# Patient Record
Sex: Male | Born: 1965 | Race: White | Hispanic: No | Marital: Married | Smoking: Never smoker
Health system: Southern US, Community
[De-identification: ages and names within clinical notes are randomized; demographics above are authoritative.]

## PROBLEM LIST (undated history)

## (undated) DIAGNOSIS — E7800 Pure hypercholesterolemia, unspecified: Secondary | ICD-10-CM

## (undated) DIAGNOSIS — N200 Calculus of kidney: Secondary | ICD-10-CM

---

## 2009-12-07 ENCOUNTER — Emergency Department: Payer: Self-pay | Admitting: Emergency Medicine

## 2018-08-19 ENCOUNTER — Other Ambulatory Visit: Payer: Self-pay

## 2018-08-19 ENCOUNTER — Emergency Department: Payer: BLUE CROSS/BLUE SHIELD

## 2018-08-19 ENCOUNTER — Emergency Department
Admission: EM | Admit: 2018-08-19 | Discharge: 2018-08-19 | Disposition: A | Discharge status: Home / Self Care | Payer: BLUE CROSS/BLUE SHIELD | Attending: Emergency Medicine | Admitting: Emergency Medicine

## 2018-08-19 DIAGNOSIS — R109 Unspecified abdominal pain: Secondary | ICD-10-CM

## 2018-08-19 DIAGNOSIS — N23 Unspecified renal colic: Secondary | ICD-10-CM | POA: Diagnosis not present

## 2018-08-19 DIAGNOSIS — R1084 Generalized abdominal pain: Secondary | ICD-10-CM | POA: Diagnosis present

## 2018-08-19 LAB — URINALYSIS, COMPLETE (UACMP) WITH MICROSCOPIC
Bilirubin Urine: NEGATIVE
Glucose, UA: NEGATIVE mg/dL
Ketones, ur: NEGATIVE mg/dL
LEUKOCYTE UA: NEGATIVE
NITRITE: NEGATIVE
PH: 8 (ref 5.0–8.0)
Protein, ur: 30 mg/dL — AB
RBC / HPF: >50 RBC/hpf — ABNORMAL HIGH (ref 0–5)
SPECIFIC GRAVITY, URINE: 1.017 (ref 1.005–1.030)

## 2018-08-19 LAB — COMPREHENSIVE METABOLIC PANEL
ALBUMIN: 4 g/dL (ref 3.5–5.0)
ALK PHOS: 83 U/L (ref 38–126)
ALT: 24 U/L (ref 0–44)
AST: 21 U/L (ref 15–41)
Anion gap: 10 (ref 5–15)
BILIRUBIN TOTAL: 0.1 mg/dL — AB (ref 0.3–1.2)
BUN: 19 mg/dL (ref 6–20)
CALCIUM: 8.7 mg/dL — AB (ref 8.9–10.3)
CO2: 26 mmol/L (ref 22–32)
CREATININE: 1.62 mg/dL — AB (ref 0.61–1.24)
Chloride: 104 mmol/L (ref 98–111)
GFR calc Af Amer: 56 mL/min — ABNORMAL LOW (ref >60)
GFR calc non Af Amer: 48 mL/min — ABNORMAL LOW (ref >60)
Glucose, Bld: 157 mg/dL — ABNORMAL HIGH (ref 70–99)
Potassium: 3.9 mmol/L (ref 3.5–5.1)
Sodium: 140 mmol/L (ref 135–145)
TOTAL PROTEIN: 6.6 g/dL (ref 6.5–8.1)

## 2018-08-19 LAB — CBC
HEMATOCRIT: 40 % (ref 39.0–52.0)
HEMOGLOBIN: 13.3 g/dL (ref 13.0–17.0)
MCH: 28.4 pg (ref 26.0–34.0)
MCHC: 33.3 g/dL (ref 30.0–36.0)
MCV: 85.3 fL (ref 80.0–100.0)
NRBC: 0 % (ref 0.0–0.2)
Platelets: 305 10*3/uL (ref 150–400)
RBC: 4.69 MIL/uL (ref 4.22–5.81)
RDW: 12.5 % (ref 11.5–15.5)
WBC: 12.3 10*3/uL — AB (ref 4.0–10.5)

## 2018-08-19 MED ORDER — SODIUM CHLORIDE 0.9 % IV BOLUS
1000.0000 mL | Freq: Once | INTRAVENOUS | Status: AC
  Administered 2018-08-19: 1000 mL via INTRAVENOUS

## 2018-08-19 MED ORDER — ONDANSETRON HCL 4 MG/2ML IJ SOLN
4.0000 mg | Freq: Once | INTRAMUSCULAR | Status: AC
  Administered 2018-08-19: 4 mg via INTRAVENOUS
  Filled 2018-08-19: qty 2

## 2018-08-19 MED ORDER — HYDROMORPHONE HCL 1 MG/ML IJ SOLN
1.0000 mg | Freq: Once | INTRAMUSCULAR | Status: AC
  Administered 2018-08-19: 1 mg via INTRAVENOUS
  Filled 2018-08-19: qty 1

## 2018-08-19 MED ORDER — OXYCODONE-ACETAMINOPHEN 5-325 MG PO TABS: 1.0000 | ORAL_TABLET | ORAL | 0 refills | Status: AC | PRN

## 2018-08-19 MED ORDER — KETOROLAC TROMETHAMINE 30 MG/ML IJ SOLN
15.0000 mg | Freq: Once | INTRAMUSCULAR | Status: AC
  Administered 2018-08-19: 15 mg via INTRAVENOUS
  Filled 2018-08-19: qty 1

## 2018-08-19 MED ORDER — ONDANSETRON 4 MG PO TBDP: 4.0000 mg | ORAL_TABLET | Freq: Three times a day (TID) | ORAL | 0 refills | Status: AC | PRN

## 2018-08-19 MED ORDER — OXYCODONE-ACETAMINOPHEN 5-325 MG PO TABS
1.0000 | ORAL_TABLET | Freq: Once | ORAL | Status: AC
  Administered 2018-08-19: 1 via ORAL
  Filled 2018-08-19: qty 1

## 2018-08-19 MED ORDER — ONDANSETRON 4 MG PO TBDP
4.0000 mg | ORAL_TABLET | Freq: Once | ORAL | Status: AC
  Administered 2018-08-19: 4 mg via ORAL
  Filled 2018-08-19: qty 1

## 2018-08-19 NOTE — ED Provider Notes (Signed)
[facility] Emergency Department Provider Note   ____________________________________________   First MD Initiated Contact with Patient 08/19/18 0111     (approximate)  I have reviewed the triage vital signs and the nursing notes.   HISTORY  Chief Complaint Flank Pain    HPI Parker Hernandez is a 53 y.o. male who presents to the ED from home with a chief complaint of right flank pain.  Patient has a history of kidney stones who had right ureteral ESBL twice at [facility]; the first time on 06/16/2018 with only a fragment of the stone lasted and the second time on 07/07/2018.  Patient states he had a follow-up KUB 2 weeks afterwards and was told all the fragments were gone.  Reports sudden onset right flank pain tonight associated with nausea.  Denies associated fever, chills, cough, congestion, chest pain, shortness of breath, abdominal pain, vomiting, hematuria.  Denies recent travel or trauma.  Denies exposure to persons diagnosed with COVID-19.        Past Medical History Nephrolithiasis Hyperlipidemia  There are no active problems to display for this patient.    Prior to Admission medications   Medication Sig Start Date End Date Taking? Authorizing Provider  ondansetron (ZOFRAN ODT) 4 MG disintegrating tablet Take 1 tablet (4 mg total) by mouth every 8 (eight) hours as needed for nausea or vomiting. 08/19/18   Irean Hong, MD  oxyCODONE-acetaminophen (PERCOCET/ROXICET) 5-325 MG tablet Take 1 tablet by mouth every 4 (four) hours as needed for severe pain. 08/19/18   Irean Hong, MD    Allergies Patient has no known allergies.  No family history on file.  Social History Social History   Tobacco Use  . Smoking status: Not on file  Substance Use Topics  . Alcohol use: Not on file  . Drug use: Not on file    Review of Systems  Constitutional: No fever/chills Eyes: No visual changes. ENT: No sore throat. Cardiovascular: Denies chest pain.  Respiratory: Denies shortness of breath. Gastrointestinal: Positive for right flank pain.  No abdominal pain.  Positive for nausea, no vomiting.  No diarrhea.  No constipation. Genitourinary: Negative for dysuria. Musculoskeletal: Negative for back pain. Skin: Negative for rash. Neurological: Negative for headaches, focal weakness or numbness.   ____________________________________________   PHYSICAL EXAM:  VITAL SIGNS: ED Triage Vitals  Enc Vitals Group     BP 08/19/18 0108 127/75     Pulse Rate 08/19/18 0107 70     Resp 08/19/18 0107 20     Temp 08/19/18 0107 98.4 F (36.9 C)     Temp Source 08/19/18 0107 Oral     SpO2 08/19/18 0107 100 %     Weight 08/19/18 0108 174 lb (78.9 kg)     Height 08/19/18 0108  (1.727 m)     Head Circumference --      Peak Flow --      Pain Score 08/19/18 0108 10     Pain Loc --      Pain Edu? --      Excl. in [place]? --     Constitutional: Alert and oriented. Well appearing and in mild to moderate acute distress. Eyes: Conjunctivae are normal. PERRL. EOMI. Head: Atraumatic. Nose: No congestion/rhinnorhea. Mouth/Throat: Mucous membranes are moist.  Oropharynx non-erythematous. Neck: No stridor.   Cardiovascular: Normal rate, regular rhythm. Grossly normal heart sounds.  Good peripheral circulation. Respiratory: Normal respiratory effort.  No retractions. Lungs CTAB. Gastrointestinal: Soft and nontender to light  or deep palpation. No distention. No abdominal bruits.  Mild right CVA tenderness. Musculoskeletal: No lower extremity tenderness nor edema.  No joint effusions. Neurologic:  Normal speech and language. No gross focal neurologic deficits are appreciated. No gait instability. Skin:  Skin is warm, dry and intact. No rash noted. Psychiatric: Mood and affect are normal. Speech and behavior are normal.  ____________________________________________   LABS (all labs ordered are listed, but only abnormal results are displayed)  Labs  Reviewed  URINALYSIS, COMPLETE (UACMP) WITH MICROSCOPIC - Abnormal; Notable for the following components:      Result Value   Color, Urine YELLOW (*)    APPearance CLOUDY (*)    Hgb urine dipstick MODERATE (*)    Protein, ur 30 (*)    RBC / HPF >50 (*)    Bacteria, UA RARE (*)    All other components within normal limits  CBC - Abnormal; Notable for the following components:   WBC 12.3 (*)    All other components within normal limits  COMPREHENSIVE METABOLIC PANEL - Abnormal; Notable for the following components:   Glucose, Bld 157 (*)    Creatinine, Ser 1.62 (*)    Calcium 8.7 (*)    Total Bilirubin 0.1 (*)    GFR calc non Af Amer 48 (*)    GFR calc Af Amer 56 (*)    All other components within normal limits   ____________________________________________  EKG  None ____________________________________________  RADIOLOGY  ED MD interpretation: 3 x 4 mm right pelvic density may resemble distal ureteral stone  Official radiology report(s): Dg Abdomen 1 View  Result Date: 08/19/2018 CLINICAL DATA:  Right flank pain. History of stones. EXAM: ABDOMEN - 1 VIEW COMPARISON:  Radiograph 12/07/2009 FINDINGS: Stones project over the mid kidneys bilaterally. There is a 3 x 4 mm density in the right pelvis that may be a distal ureteric stone versus overlying stool. Normal bowel gas pattern without obstruction. No evidence of free air. Lung bases are clear. No osseous abnormalities. IMPRESSION: 1. Bilateral nephrolithiasis. 2. A 3 x 4 mm density in the right pelvis may be a distal ureteric stone versus overlying stool. Electronically Signed   By: Narda Rutherford M.D.   On: 08/19/2018 01:51    ____________________________________________   PROCEDURES  Procedure(s) performed (including Critical Care):  Procedures   ____________________________________________   INITIAL IMPRESSION / ASSESSMENT AND PLAN / ED COURSE  As part of my medical decision making, I reviewed the following  data within the electronic MEDICAL RECORD NUMBER Nursing notes reviewed and incorporated, Labs reviewed, Old chart reviewed, Radiograph reviewed and Notes from prior ED visits        53 year old male with a history of kidney stones who presents with right flank pain.  Recent ESBL of right ureteral stone x2. Differential diagnosis includes, but is not limited to, acute appendicitis, renal colic, testicular torsion, urinary tract infection/pyelonephritis, prostatitis,  epididymitis, diverticulitis, small bowel obstruction or ileus, colitis, abdominal aortic aneurysm, gastroenteritis, hernia, etc.  Will obtain basic lab work, urinalysis, initiate IV fluid resuscitation.  Administer 1 mg IV Dilaudid for pain paired with 4 mg IV Zofran for nausea.  Will obtain KUB to evaluate for retained stone.   Clinical Course as of Aug 19 526  Sat Aug 19, 2018  0302 Updated patient on imaging and laboratory results.  Still complaining of severe pain.  Will re-dose IV Dilaudid, add IV Toradol and administer second liter IV fluids as patient has not yet urinated.  States he  cannot tolerate Flomax secondary to dizziness.   [JS]  0424 Updated patient on urine result.  Pain is significantly better.  Will discharge home with prescription for Percocet and Zofran, and he will follow-up with his urologist as needed.  Strict return precautions given.  Patient verbalizes understanding agrees with plan of care.   [JS]    Clinical Course User Index [JS] Irean Hong, MD     ____________________________________________   FINAL CLINICAL IMPRESSION(S) / ED DIAGNOSES  Final diagnoses:  Right flank pain  Ureteral colic     ED Discharge Orders         Ordered    oxyCODONE-acetaminophen (PERCOCET/ROXICET) 5-325 MG tablet  Every 4 hours PRN     08/19/18 0426    ondansetron (ZOFRAN ODT) 4 MG disintegrating tablet  Every 8 hours PRN     08/19/18 0426           Note:  This document was prepared using Dragon voice  recognition software and may include unintentional dictation errors.   Irean Hong, MD 08/19/18 [id number]

## 2018-08-19 NOTE — ED Triage Notes (Signed)
Pt with right flank pain, vomiting that began 2 hours pta. Pt with history of renal calculi. States feels similar to past stones.

## 2018-08-19 NOTE — Discharge Instructions (Addendum)
1. Take pain & nausea medicines as needed (Percocet/Zofran #30). Make sure to take a stool softener while taking narcotic pain medicines. 2. Drink plenty of bottled or filtered water daily. 3. Return to the ER for worsening symptoms, persistent vomiting, fever, difficulty breathing or other concerns.

## 2019-10-28 IMAGING — DX ABDOMEN - 1 VIEW
2 series · 2 of 2 positions shown · non-contrast
Comparison: Radiograph 12/07/2009

CLINICAL DATA: Right flank pain. History of stones.

EXAM:
ABDOMEN - 1 VIEW

[abdomen supine (1 of 2)]
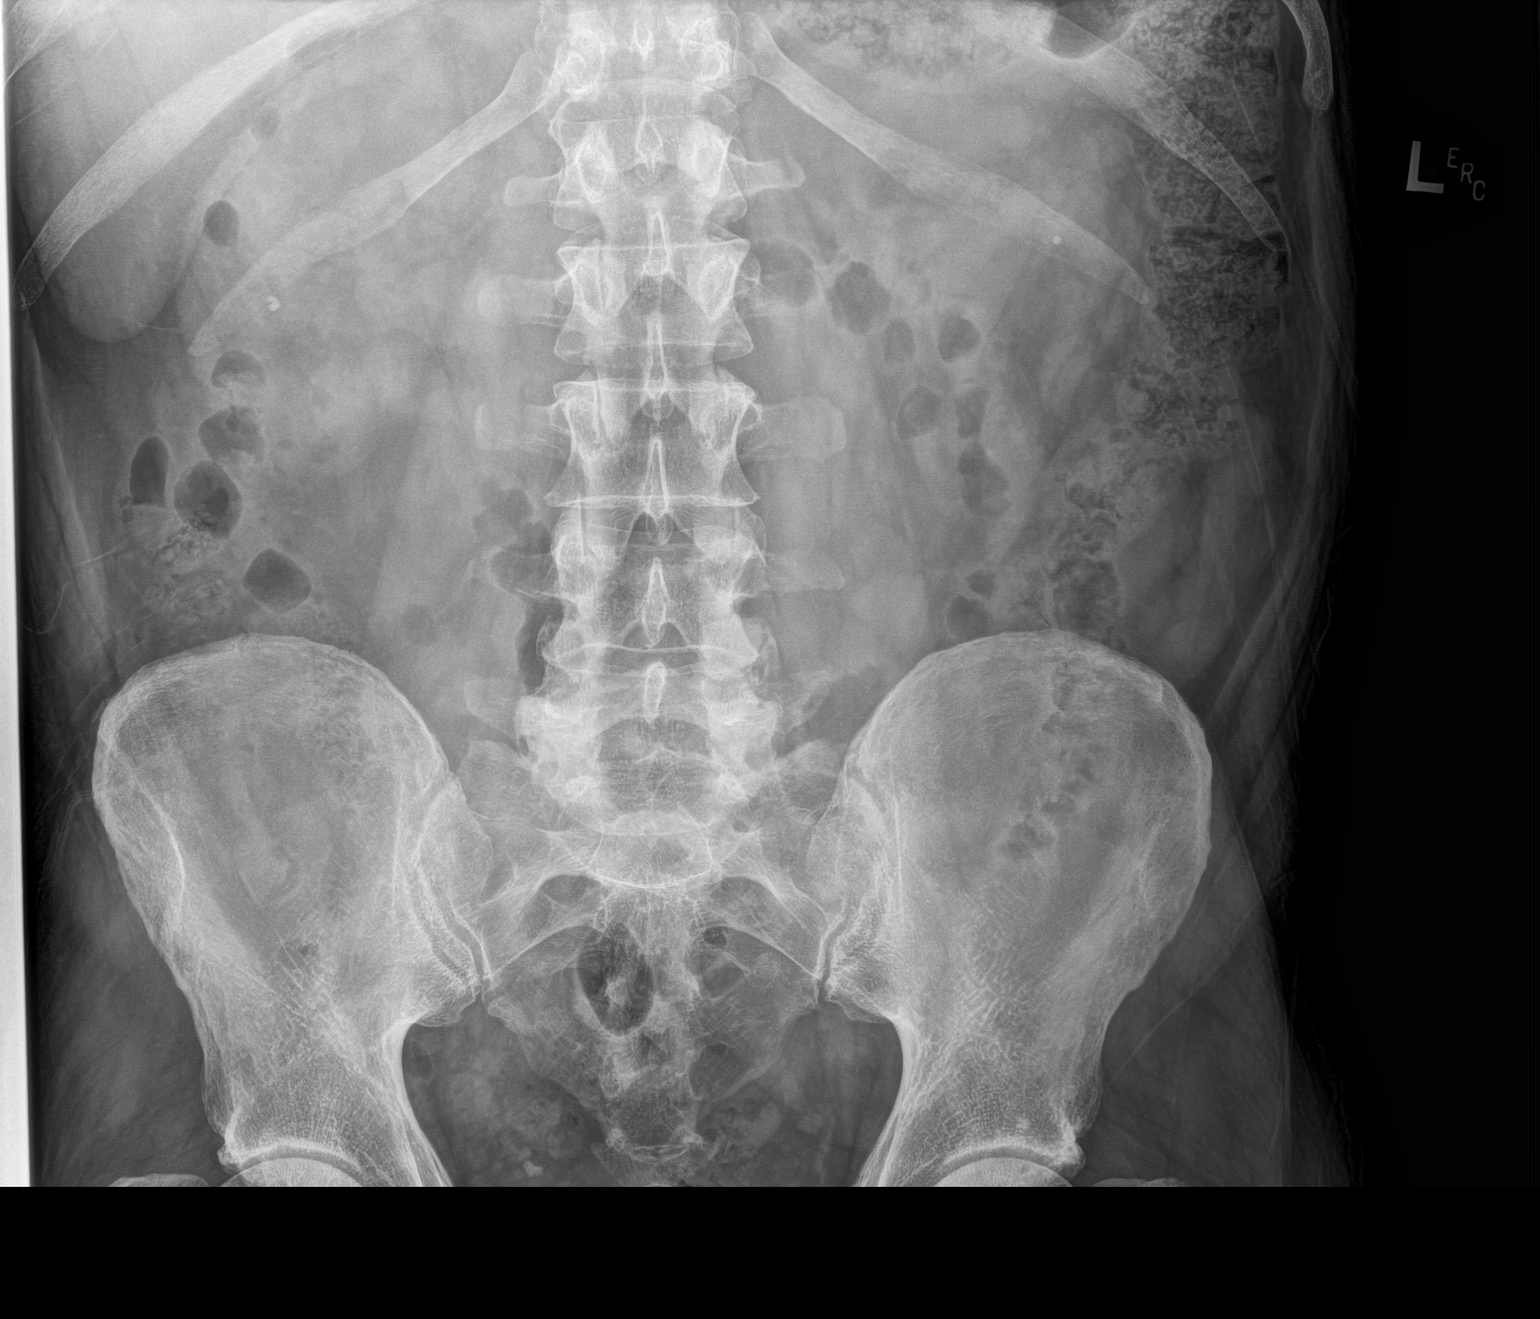

[abdomen supine (2 of 2)]
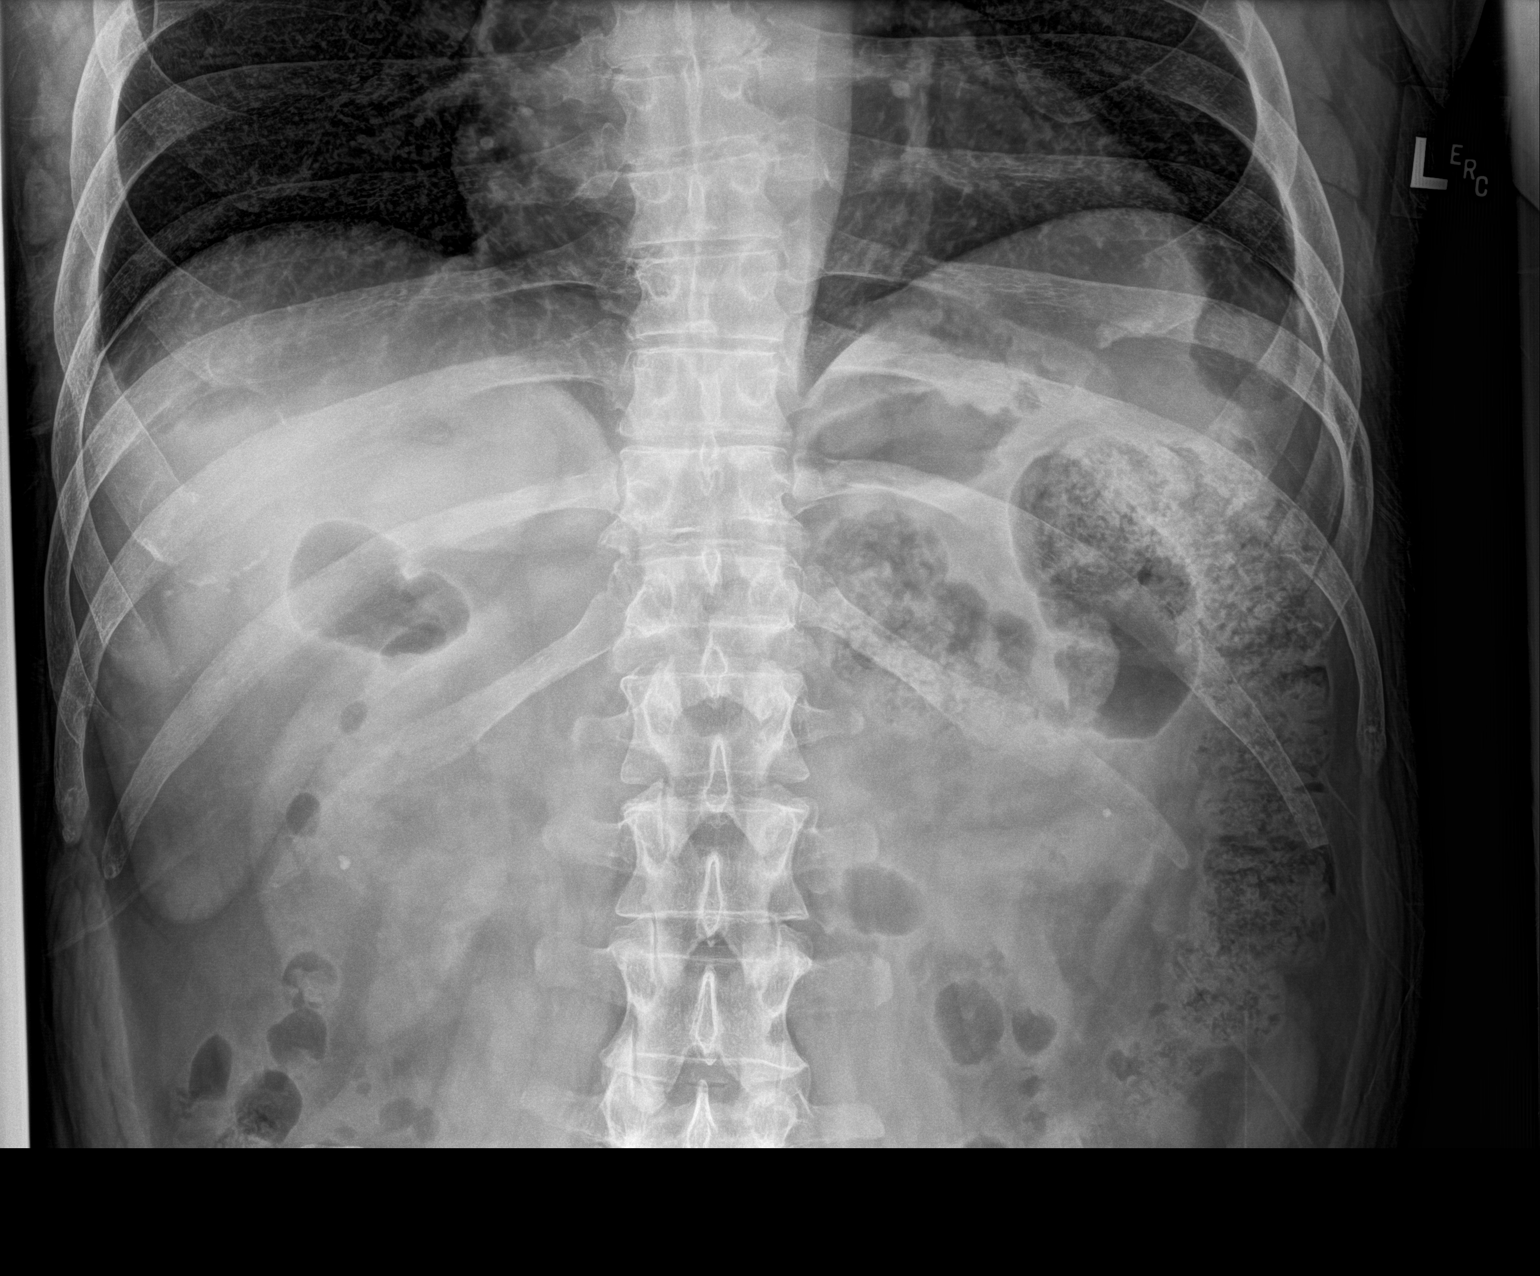

[2 of 2 positions shown; findings below may reference images not displayed]

FINDINGS: Stones project over the mid kidneys bilaterally. There is a 3 x 4 mm
density in the right pelvis that may be a distal ureteric stone
versus overlying stool. Normal bowel gas pattern without
obstruction. No evidence of free air. Lung bases are clear. No
osseous abnormalities.
IMPRESSION: 1. Bilateral nephrolithiasis.
2. A 3 x 4 mm density in the right pelvis may be a distal ureteric
stone versus overlying stool.

## 2021-04-29 ENCOUNTER — Ambulatory Visit
Admission: EM | Admit: 2021-04-29 | Discharge: 2021-04-29 | Disposition: A | Discharge status: Home / Self Care | Payer: BC Managed Care – PPO | Attending: Emergency Medicine | Admitting: Emergency Medicine

## 2021-04-29 ENCOUNTER — Other Ambulatory Visit: Payer: Self-pay

## 2021-04-29 ENCOUNTER — Ambulatory Visit (INDEPENDENT_AMBULATORY_CARE_PROVIDER_SITE_OTHER): Payer: BC Managed Care – PPO

## 2021-04-29 DIAGNOSIS — R109 Unspecified abdominal pain: Secondary | ICD-10-CM | POA: Diagnosis present

## 2021-04-29 HISTORY — DX: Pure hypercholesterolemia, unspecified: E78.00

## 2021-04-29 HISTORY — DX: Calculus of kidney: N20.0

## 2021-04-29 LAB — URINALYSIS, COMPLETE (UACMP) WITH MICROSCOPIC
Bilirubin Urine: NEGATIVE
Glucose, UA: NEGATIVE mg/dL
Hgb urine dipstick: NEGATIVE
Ketones, ur: NEGATIVE mg/dL
Leukocytes,Ua: NEGATIVE
Nitrite: NEGATIVE
Protein, ur: NEGATIVE mg/dL
Specific Gravity, Urine: 1.015 (ref 1.005–1.030)
pH: 6.5 (ref 5.0–8.0)

## 2021-04-29 MED ORDER — KETOROLAC TROMETHAMINE 10 MG PO TABS: 10.0000 mg | ORAL_TABLET | Freq: Four times a day (QID) | ORAL | 0 refills | Status: AC | PRN

## 2021-04-29 MED ORDER — KETOROLAC TROMETHAMINE 60 MG/2ML IM SOLN
60.0000 mg | Freq: Once | INTRAMUSCULAR | Status: AC
  Administered 2021-04-29: 60 mg via INTRAMUSCULAR

## 2021-04-29 MED ORDER — BACLOFEN 10 MG PO TABS: 10.0000 mg | ORAL_TABLET | Freq: Three times a day (TID) | ORAL | 0 refills | Status: AC

## 2021-04-29 NOTE — Discharge Instructions (Addendum)
Your urinalysis today did not show any signs of blood or infection and your x-ray did not show the presence of any kidney stones on the right side of your body.  You have a stable left kidney stone that has been visualized on previous exams and has not moved.  Take the Toradol every 6 hours with food as needed for pain.  Use the baclofen every 8 hours as needed for muscle tension.  The baclofen and Robaxin at the same time.  I would hold the Robaxin for now and just use the baclofen.  I recommend scheduling a follow-up appointment with your urologist if your symptoms continue.

## 2021-04-29 NOTE — ED Triage Notes (Signed)
Patient presents to Urgent Care with complaints of right sided flank pain x 3 days ago. Increased urinary frequency x 2 days ago. Has a hx of kidney stones. Treating pain with warm baths and tylenol with no improvement.  Denies fever, hematuria.

## 2021-04-29 NOTE — ED Provider Notes (Signed)
[facility]    CSN: [id number] Arrival date & time: 04/29/21  1452      History   Chief Complaint Chief Complaint  Patient presents with   Flank Pain   Urinary Frequency    HPI Parker Hernandez is a 55 y.o. male.   HPI  55 year old male here for evaluation of right flank pain.  Patient ports that he has been experiencing pain in his right flank for last 3 days and then developed urinary frequency yesterday.  He denies any pain with urination, urgency of urination, blood in his urine this, nausea, vomiting, or pain in his testicles.  He states that he he tried taking a muscle relaxer in case it was his back and that did not give him any relief.  He has been using Tylenol and soaking in the tub at home to help deal with the pain.  He has a history of renal stones and states this feels similar to his stones in the past.  In the past he is required lithotripsy as the stones were too large to pass by themselves.  Past Medical History:  Diagnosis Date   High cholesterol    Kidney stone     There are no problems to display for this patient.   History reviewed. No pertinent surgical history.     Home Medications    Prior to Admission medications   Medication Sig Start Date End Date Taking? Authorizing Provider  baclofen (LIORESAL) 10 MG tablet Take 1 tablet (10 mg total) by mouth 3 (three) times daily. 04/29/21  Yes Margarette Canada, NP  ketorolac (TORADOL) 10 MG tablet Take 1 tablet (10 mg total) by mouth every 6 (six) hours as needed. 04/29/21  Yes Margarette Canada, NP  methocarbamol (ROBAXIN) 750 MG tablet Take by mouth. 01/08/21  Yes [provider]  pravastatin (PRAVACHOL) 40 MG tablet TAKE ONE TABLET (40 MG DOSE) BY MOUTH DAILY. TAKE 1 TABLET EVERY EVENING 05/10/18  Yes [provider]  ondansetron (ZOFRAN ODT) 4 MG disintegrating tablet Take 1 tablet (4 mg total) by mouth every 8 (eight) hours as needed for nausea or vomiting. 08/19/18   Paulette Blanch, MD   oxyCODONE-acetaminophen (PERCOCET/ROXICET) 5-325 MG tablet Take 1 tablet by mouth every 4 (four) hours as needed for severe pain. 08/19/18   Paulette Blanch, MD    Family History History reviewed. No pertinent family history.  Social History Social History   Tobacco Use   Smoking status: Never   Smokeless tobacco: Never  Vaping Use   Vaping Use: Never used  Substance Use Topics   Alcohol use: Yes    Comment: social drinker   Drug use: Never     Allergies   Patient has no known allergies.   Review of Systems Review of Systems  Constitutional:  Negative for activity change, appetite change and diaphoresis.  Gastrointestinal:  Negative for abdominal pain, nausea and vomiting.  Genitourinary:  Positive for flank pain and frequency. Negative for dysuria, hematuria, testicular pain and urgency.  Musculoskeletal:  Positive for back pain.  Skin:  Negative for rash.  Hematological: Negative.   Psychiatric/Behavioral: Negative.      Physical Exam Triage Vital Signs ED Triage Vitals  Enc Vitals Group     BP 04/29/21 1559 (!) 144/91     Pulse Rate 04/29/21 1559 60     Resp 04/29/21 1559 16     Temp 04/29/21 1559 98.6 F (37 C)     Temp Source 04/29/21  1559 Oral     SpO2 04/29/21 1559 100 %     Weight 04/29/21 1600 182 lb (82.6 kg)     Height --      Head Circumference --      Peak Flow --      Pain Score 04/29/21 1556 8     Pain Loc --      Pain Edu? --      Excl. in [place]? --    No data found.  Updated Vital Signs BP (!) 144/91 (BP Location: Left Arm)   Pulse 60   Temp 98.6 F (37 C) (Oral)   Resp 16   Wt 182 lb (82.6 kg)   SpO2 100%   BMI 27.67 kg/m   Visual Acuity Right Eye Distance:   Left Eye Distance:   Bilateral Distance:    Right Eye Near:   Left Eye Near:    Bilateral Near:     Physical Exam Vitals and nursing note reviewed.  Constitutional:      General: He is in acute distress.     Appearance: Normal appearance. He is not ill-appearing.   HENT:     Head: Normocephalic and atraumatic.  Cardiovascular:     Rate and Rhythm: Normal rate and regular rhythm.     Pulses: Normal pulses.     Heart sounds: Normal heart sounds. No murmur heard.   No gallop.  Pulmonary:     Effort: Pulmonary effort is normal.     Breath sounds: Normal breath sounds. No wheezing, rhonchi or rales.  Abdominal:     General: There is no distension.     Tenderness: There is no abdominal tenderness. There is no right CVA tenderness, left CVA tenderness, guarding or rebound.  Skin:    General: Skin is warm and dry.     Capillary Refill: Capillary refill takes less than 2 seconds.     Findings: No erythema or rash.  Neurological:     General: No focal deficit present.     Mental Status: He is alert and oriented to person, place, and time.  Psychiatric:        Mood and Affect: Mood normal.        Behavior: Behavior normal.        Thought Content: Thought content normal.        Judgment: Judgment normal.     UC Treatments / Results  Labs (all labs ordered are listed, but only abnormal results are displayed) Labs Reviewed  URINALYSIS, COMPLETE (UACMP) WITH MICROSCOPIC - Abnormal; Notable for the following components:      Result Value   Bacteria, UA RARE (*)    All other components within normal limits    EKG   Radiology DG Abdomen 1 View  Result Date: 04/29/2021 CLINICAL DATA:  Acute right flank pain. EXAM: ABDOMEN - 1 VIEW COMPARISON:  August 19, 2018. FINDINGS: The bowel gas pattern is normal. Stable small nonobstructive left renal calculus. Right renal calculus noted on prior exam is not well visualized currently. IMPRESSION: Stable small left renal calculus. Electronically Signed   By: Marijo Conception M.D.   On: 04/29/2021 16:32    Procedures Procedures (including critical care time)  Medications Ordered in UC Medications  ketorolac (TORADOL) injection 60 mg (60 mg Intramuscular Given 04/29/21 1637)    Initial Impression /  Assessment and Plan / UC Course  I have reviewed the triage vital signs and the nursing notes.  Pertinent labs & imaging results  that were available during my care of the patient were reviewed by me and considered in my medical decision making (see chart for details).  Patient is a pleasant 55 year old male who is here for evaluation of right flank pain and appears to be in a moderate degree of discomfort.  He has a history of renal stones and states he has had had lithotripsy in the past to break them up with her tubing to pass on their own.  He states its been a number of years since he has had a renal stone.  He does currently see a urologist in [place].  Patient reports that the pain is similar to when he had kidney stones in the past.  He initially thought he might of done something to his back, as he has a previous history of a back injury, and took a muscle laxer without any relief.  He has been using Tylenol and soaking in a tub at home to be more comfortable.  He is also having increased urinary frequency but no urgency or dysuria.  No hematuria.  Physical exam reveals a benign cardiopulmonary exam.  No CVA tenderness on exam.  Abdomen soft, flat, and nontender.  Some pain can be elicited by palpating the musculature in the lower thoracic and upper lumbar right paraspinous region.  No overt spasm or cording noted on exam.  We will check urinalysis and KUB for the possibility of a stone.  We will also medicate the patient with Toradol IM for pain relief.  Urinalysis is unremarkable save for rare bacteria.  KUB independently reviewed and evaluated by me.  Impression: Patient has a significant stool burden in his ascending and part of his transverse colon.  Nonspecific air-fluid levels.  There is a small radiopaque object in the area of the left kidney.  Radiology overread is pending. Radiology impression is stable small left renal calculus.  Given the lack of abnormal urine findings and the lack of  renal calculi on the right side on KUB I am unsure that patient's pain is being caused by the movement of a stone.  He is also tender, with reproducible pain, when palpating the left lower thoracic and upper lumbar paraspinous region which lends credence to the possibility of a musculoskeletal source.  I am going to discharge patient home on Toradol to help with his discomfort, Baclofen for muscle pain, and have him follow-up with his urologist or his PCP for continued symptoms.   Final Clinical Impressions(s) / UC Diagnoses   Final diagnoses:  Flank pain     Discharge Instructions      Your urinalysis today did not show any signs of blood or infection and your x-ray did not show the presence of any kidney stones on the right side of your body.  You have a stable left kidney stone that has been visualized on previous exams and has not moved.  Take the Toradol every 6 hours with food as needed for pain.  Use the baclofen every 8 hours as needed for muscle tension.  The baclofen and Robaxin at the same time.  I would hold the Robaxin for now and just use the baclofen.  I recommend scheduling a follow-up appointment with your urologist if your symptoms continue.     ED Prescriptions     Medication Sig Dispense Auth. Provider   ketorolac (TORADOL) 10 MG tablet Take 1 tablet (10 mg total) by mouth every 6 (six) hours as needed. 20 tablet Becky Augusta, NP  baclofen (LIORESAL) 10 MG tablet Take 1 tablet (10 mg total) by mouth 3 (three) times daily. 47 each Margarette Canada, NP      PDMP not reviewed this encounter.   Margarette Canada, NP 04/29/21 1658
